# Patient Record
Sex: Female | Born: 1970 | Race: Black or African American | Hispanic: No | Marital: Single
Health system: Southern US, Community
[De-identification: ages and names within clinical notes are randomized; demographics above are authoritative.]

---

## 2017-01-21 ENCOUNTER — Emergency Department: Payer: BC Managed Care – PPO

## 2017-01-21 ENCOUNTER — Emergency Department
Admission: EM | Admit: 2017-01-21 | Discharge: 2017-01-21 | Disposition: A | Discharge status: Home / Self Care | Payer: BC Managed Care – PPO | Attending: Emergency Medicine | Admitting: Emergency Medicine

## 2017-01-21 DIAGNOSIS — M25532 Pain in left wrist: Secondary | ICD-10-CM | POA: Insufficient documentation

## 2017-01-21 MED ORDER — METHYLPREDNISOLONE SODIUM SUCC 125 MG IJ SOLR
125.0000 mg | Freq: Once | INTRAMUSCULAR | Status: AC
  Administered 2017-01-21: 125 mg via INTRAMUSCULAR
  Filled 2017-01-21: qty 2

## 2017-01-21 MED ORDER — IBUPROFEN 600 MG PO TABS: 600.0000 mg | ORAL_TABLET | Freq: Four times a day (QID) | ORAL | 0 refills | Status: AC | PRN

## 2017-01-21 MED ORDER — PREDNISONE 10 MG PO TABS: ORAL_TABLET | ORAL | 0 refills | Status: AC

## 2017-01-21 MED ORDER — KETOROLAC TROMETHAMINE 30 MG/ML IJ SOLN
30.0000 mg | Freq: Once | INTRAMUSCULAR | Status: AC
  Administered 2017-01-21: 30 mg via INTRAVENOUS
  Filled 2017-01-21: qty 1

## 2017-01-21 NOTE — ED Provider Notes (Signed)
[facility] Emergency Department Provider Note  ____________________________________________  Time seen: Approximately 12:57 PM  I have reviewed the triage vital signs and the nursing notes.   HISTORY  Chief Complaint Wrist Pain    HPI Barbara Willis is a 46 y.o. female that presents to the emergency department with left wrist pain for 2 days. Pain is primarily on the underside of her wrist. Occasionally her fingers will go numb and tingle. Numbness and tingling woke her up last night. 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 No alleviating measures have been attempted. No injury. No history of gout or carpal tunnel. No fever, shortness of breath, chest pain, nausea, vomiting, abdominal pain.   No past medical history on file.  There are no active problems to display for this patient.   No past surgical history on file.  Prior to Admission medications   Medication Sig Start Date End Date Taking? Authorizing Provider  ibuprofen (ADVIL,MOTRIN) 600 MG tablet Take 1 tablet (600 mg total) by mouth every 6 (six) hours as needed. 01/21/17   Enid Derry, PA-C  predniSONE (DELTASONE) 10 MG tablet Take 6 tablets on day 1, take 5 tablets on day 2, take 4 tablets on day 3, take 3 tablets on day 4, take 2 tablets on day 5, take 1 tablet on day 6 01/21/17   Enid Derry, PA-C    Allergies Patient has no allergy information on record.  No family history on file.  Social History Social History  Substance Use Topics  . Smoking status: Not on file  . Smokeless tobacco: Not on file  . Alcohol use Not on file     Review of Systems  Constitutional: No fever/chills Cardiovascular: No chest pain. Respiratory: No SOB. Gastrointestinal: No abdominal pain.  No nausea, no vomiting.  Skin: Negative for rash, abrasions, lacerations, ecchymosis. Neurological: Negative for headaches   ____________________________________________   PHYSICAL  EXAM:  VITAL SIGNS: ED Triage Vitals [01/21/17 1142]  Enc Vitals Group     BP 131/72     Pulse Rate 71     Resp 18     Temp 98.7 F (37.1 C)     Temp Source Oral     SpO2 100 %     Weight 215 lb (97.5 kg)     Height  (1.549 m)     Head Circumference      Peak Flow      Pain Score      Pain Loc      Pain Edu?      Excl. in GC?      Constitutional: Alert and oriented. Well appearing and in no acute distress. Eyes: Conjunctivae are normal. PERRL. EOMI. Head: Atraumatic. ENT:      Ears:      Nose: No congestion/rhinnorhea.      Mouth/Throat: Mucous membranes are moist.  Neck: No stridor.   Cardiovascular: Normal rate, regular rhythm.  Good peripheral circulation. 2+ radial pulses. Respiratory: Normal respiratory effort without tachypnea or retractions. Lungs CTAB. Good air entry to the bases with no decreased or absent breath sounds. Musculoskeletal: Full range of motion to all extremities. No gross deformities appreciated. Pain to palmar side of left wrist. Pain with all movement of left wrist. Positive Tinel's and Phalen's. No visible swelling. Neurologic:  Normal speech and language. No gross focal neurologic deficits are appreciated.  Skin:  Skin is warm, dry and intact. No rash noted.   ____________________________________________  LABS (all labs ordered are listed, but only abnormal results are displayed)  Labs Reviewed - No data to display ____________________________________________  EKG   ____________________________________________  RADIOLOGY Lexine Baton, personally viewed and evaluated these images (plain radiographs) as part of my medical decision making, as well as reviewing the written report by the radiologist.  Dg Wrist Complete Left  Result Date: 01/21/2017 CLINICAL DATA:  Initial encounter for Pt c/o pain in left wrist since Thursday. Pain radiates to fingers and unable to move fingers. No known injury. EXAM: LEFT WRIST - COMPLETE 3+  VIEW COMPARISON:  None. FINDINGS: No acute fracture or dislocation. Joint spaces are maintained for age. Scaphoid intact. IMPRESSION: No acute osseous abnormality. Electronically Signed   By: Jeronimo Greaves M.D.   On: 01/21/2017 12:40    ____________________________________________    PROCEDURES  Procedure(s) performed:    Procedures    Medications  ketorolac (TORADOL) 30 MG/ML injection 30 mg (30 mg Intravenous Given 01/21/17 1326)  methylPREDNISolone sodium succinate (SOLU-MEDROL) 125 mg/2 mL injection 125 mg (125 mg Intramuscular Given 01/21/17 1326)     ____________________________________________   INITIAL IMPRESSION / ASSESSMENT AND PLAN / ED COURSE  Pertinent labs & imaging results that were available during my care of the patient were reviewed by me and considered in my medical decision making (see chart for details).  Review of the [place] CSRS was performed in accordance of the [facility] prior to dispensing any controlled drugs.   She presented to the emergency department for evaluation of left wrist pain for 2 days. Symptoms are consistent with carpal tunnel. Xray negative for acute bony abnormalities. She was given Toradol and solumedrol in ED. She was also given a wrist splint. Patient will be discharged home with prescriptions for prednisone and ibuprofen. Patient is to follow up with PCP as directed. Patient is given ED precautions to return to the ED for any worsening or new symptoms.     ____________________________________________  FINAL CLINICAL IMPRESSION(S) / ED DIAGNOSES  Final diagnoses:  Left wrist pain      NEW MEDICATIONS STARTED DURING THIS VISIT:  Discharge Medication List as of 01/21/2017  1:47 PM    START taking these medications   Details  ibuprofen (ADVIL,MOTRIN) 600 MG tablet Take 1 tablet (600 mg total) by mouth every 6 (six) hours as needed., Starting Sun 01/21/2017, Print    predniSONE (DELTASONE) 10 MG tablet Take 6 tablets on day 1, take 5  tablets on day 2, take 4 tablets on day 3, take 3 tablets on day 4, take 2 tablets on day 5, take 1 tablet on day 6, Print            This chart was dictated using voice recognition software/Dragon. Despite best efforts to proofread, errors can occur which can change the meaning. Any change was purely unintentional.    Enid Derry, PA-C 01/21/17 1633    Jene Every, MD 01/22/17 [phone]

## 2017-01-21 NOTE — ED Notes (Signed)
See triage note  States she developed pain with some swelling on Thursday  Unsure of injury   Good pulses  No deformity noted

## 2017-01-21 NOTE — ED Triage Notes (Signed)
Patient arrival to [facility] via POV with complaint of left wrist pain. Patient states that the pain started after bowling on Thursday and has progressed.

## 2019-02-19 IMAGING — DX DG WRIST COMPLETE 3+V*L*
4 series · 4 of 4 positions shown · non-contrast
Comparison: None.

CLINICAL DATA: Initial encounter for Pt c/o pain in left wrist
since [REDACTED]. Pain radiates to fingers and unable to move fingers.
No known injury.

EXAM:
LEFT WRIST - COMPLETE 3+ VIEW

[wrist ap (1 of 2)]
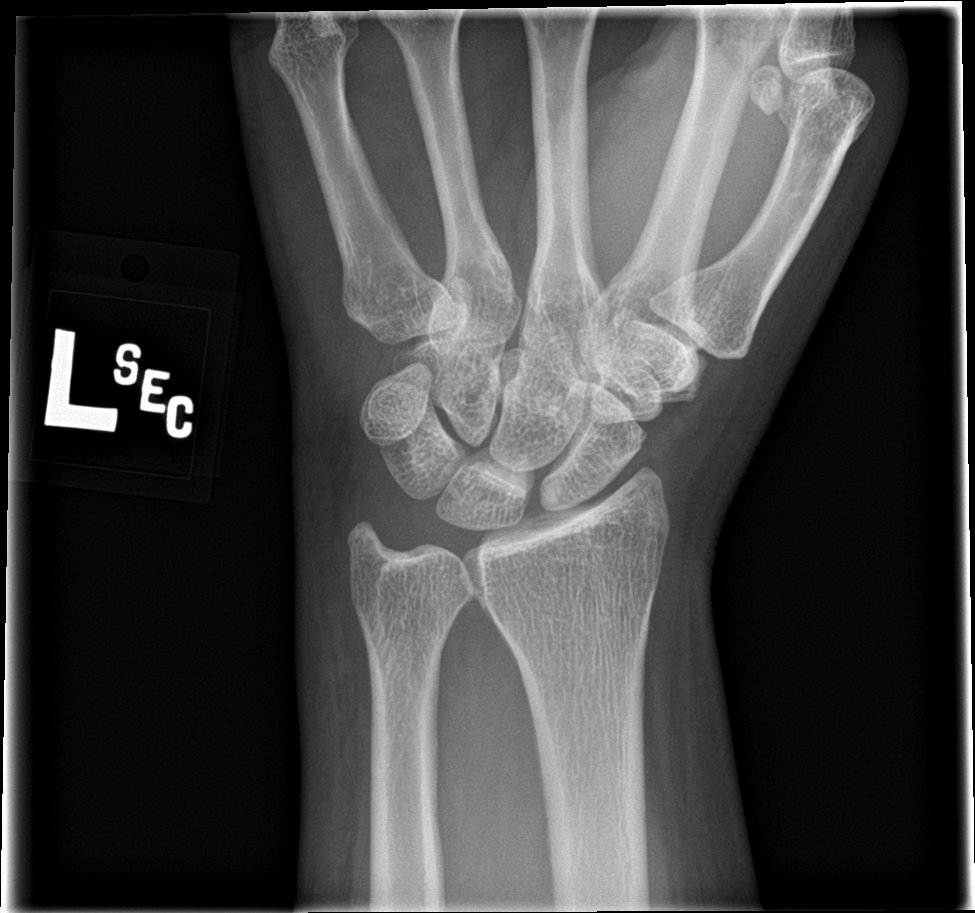

[wrist obl]
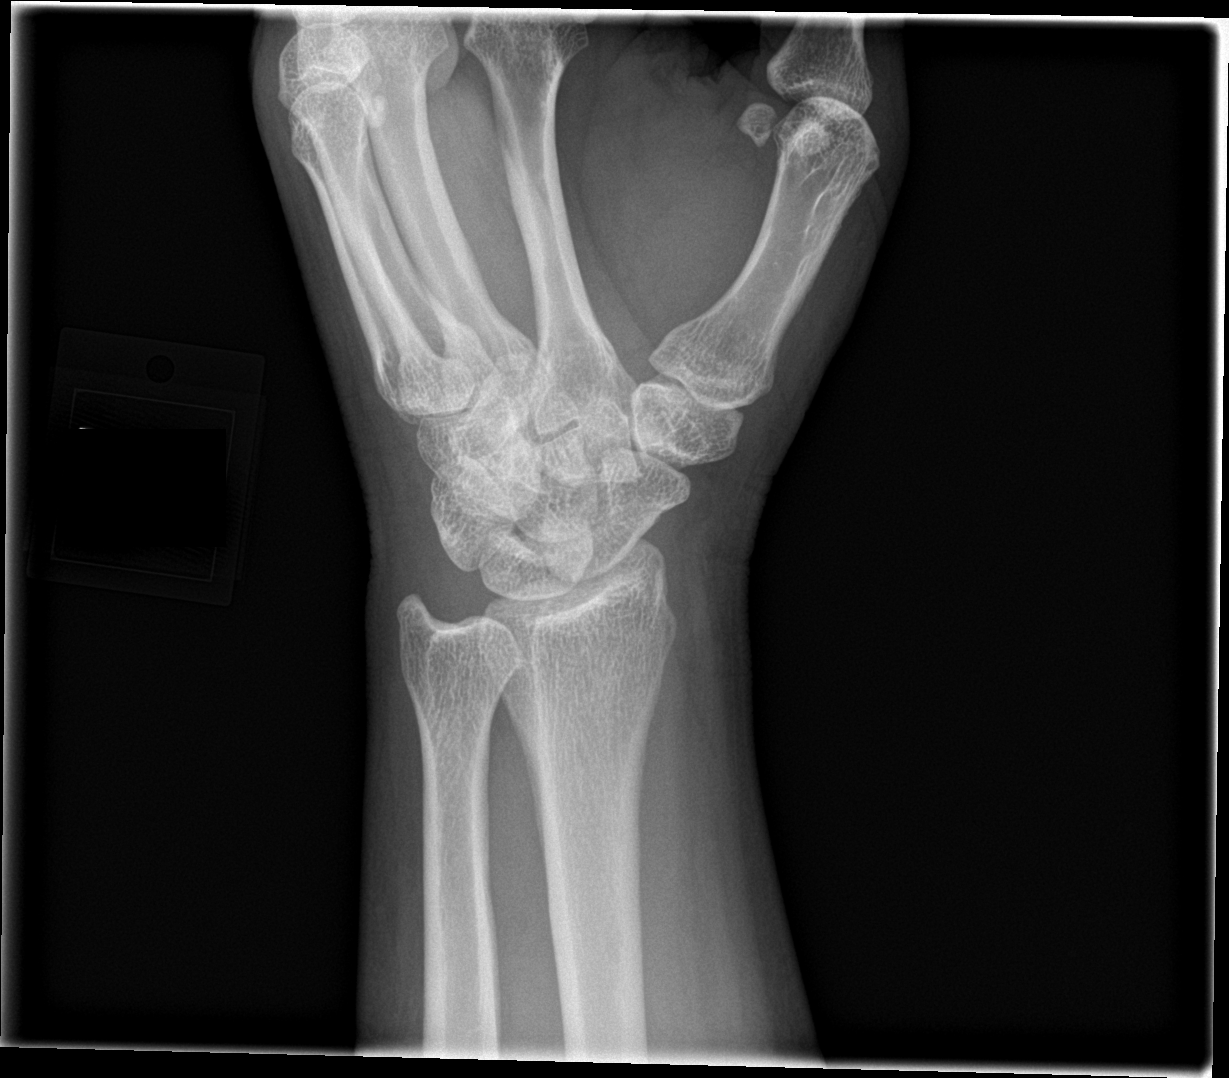

[wrist lat]
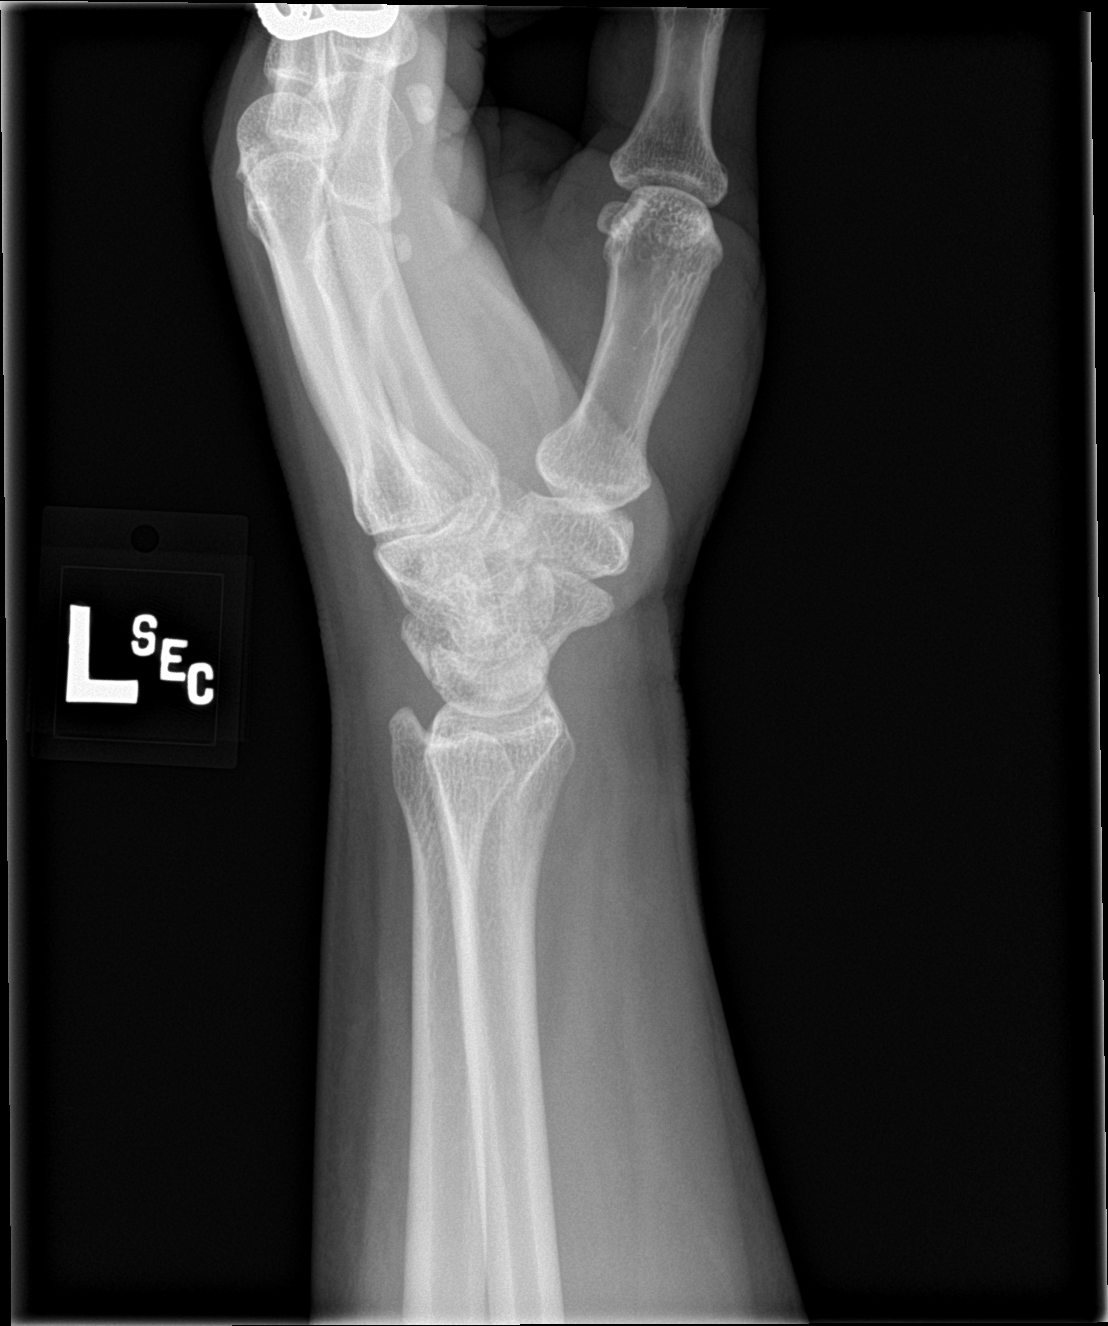

[wrist ap (2 of 2)]
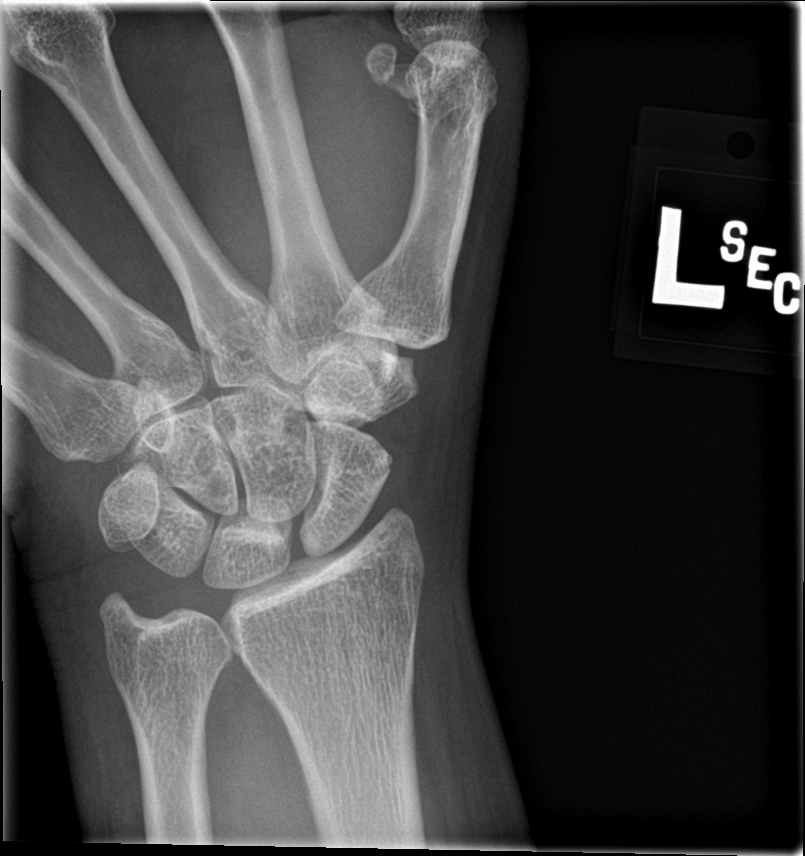

[4 of 4 positions shown; findings below may reference images not displayed]

FINDINGS: No acute fracture or dislocation. Joint spaces are maintained for
age. Scaphoid intact.
IMPRESSION: No acute osseous abnormality.
